# Patient Record
Sex: Female | Born: 1995 | Race: Black or African American | Hispanic: No | Marital: Single | Smoking: Never smoker
Health system: Southern US, Community
[De-identification: ages and names within clinical notes are randomized; demographics above are authoritative.]

---
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
## 2021-01-08 ENCOUNTER — Emergency Department (HOSPITAL_BASED_OUTPATIENT_CLINIC_OR_DEPARTMENT_OTHER): Payer: Medicaid Other

## 2021-01-08 ENCOUNTER — Encounter (HOSPITAL_BASED_OUTPATIENT_CLINIC_OR_DEPARTMENT_OTHER): Payer: Self-pay | Admitting: *Deleted

## 2021-01-08 ENCOUNTER — Other Ambulatory Visit: Payer: Self-pay

## 2021-01-08 ENCOUNTER — Emergency Department (HOSPITAL_BASED_OUTPATIENT_CLINIC_OR_DEPARTMENT_OTHER)
Admission: EM | Admit: 2021-01-08 | Discharge: 2021-01-08 | Disposition: A | Discharge status: Home / Self Care | Payer: Medicaid Other | Attending: Emergency Medicine | Admitting: Emergency Medicine

## 2021-01-08 DIAGNOSIS — N760 Acute vaginitis: Secondary | ICD-10-CM | POA: Diagnosis not present

## 2021-01-08 DIAGNOSIS — B9689 Other specified bacterial agents as the cause of diseases classified elsewhere: Secondary | ICD-10-CM | POA: Insufficient documentation

## 2021-01-08 DIAGNOSIS — R102 Pelvic and perineal pain: Secondary | ICD-10-CM | POA: Diagnosis present

## 2021-01-08 DIAGNOSIS — N730 Acute parametritis and pelvic cellulitis: Secondary | ICD-10-CM

## 2021-01-08 DIAGNOSIS — F17200 Nicotine dependence, unspecified, uncomplicated: Secondary | ICD-10-CM | POA: Diagnosis not present

## 2021-01-08 LAB — URINALYSIS, MICROSCOPIC (REFLEX)

## 2021-01-08 LAB — CBC WITH DIFFERENTIAL/PLATELET
Abs Immature Granulocytes: 0.11 10*3/uL — ABNORMAL HIGH (ref 0.00–0.07)
Basophils Absolute: 0 10*3/uL (ref 0.0–0.1)
Basophils Relative: 0 %
Eosinophils Absolute: 0 10*3/uL (ref 0.0–0.5)
Eosinophils Relative: 0 %
HCT: 38.4 % (ref 36.0–46.0)
Hemoglobin: 13.5 g/dL (ref 12.0–15.0)
Immature Granulocytes: 1 %
Lymphocytes Relative: 7 %
Lymphs Abs: 1.1 10*3/uL (ref 0.7–4.0)
MCH: 29.6 pg (ref 26.0–34.0)
MCHC: 35.2 g/dL (ref 30.0–36.0)
MCV: 84.2 fL (ref 80.0–100.0)
Monocytes Absolute: 1.1 10*3/uL — ABNORMAL HIGH (ref 0.1–1.0)
Monocytes Relative: 6 %
Neutro Abs: 15.1 10*3/uL — ABNORMAL HIGH (ref 1.7–7.7)
Neutrophils Relative %: 86 %
Platelets: 284 10*3/uL (ref 150–400)
RBC: 4.56 MIL/uL (ref 3.87–5.11)
RDW: 13.4 % (ref 11.5–15.5)
WBC: 17.4 10*3/uL — ABNORMAL HIGH (ref 4.0–10.5)
nRBC: 0 % (ref 0.0–0.2)

## 2021-01-08 LAB — COMPREHENSIVE METABOLIC PANEL
ALT: 14 U/L (ref 0–44)
AST: 15 U/L (ref 15–41)
Albumin: 4 g/dL (ref 3.5–5.0)
Alkaline Phosphatase: 66 U/L (ref 38–126)
Anion gap: 11 (ref 5–15)
BUN: 12 mg/dL (ref 6–20)
CO2: 22 mmol/L (ref 22–32)
Calcium: 9.2 mg/dL (ref 8.9–10.3)
Chloride: 104 mmol/L (ref 98–111)
Creatinine, Ser: 0.77 mg/dL (ref 0.44–1.00)
GFR, Estimated: >60 mL/min (ref >60)
Glucose, Bld: 95 mg/dL (ref 70–99)
Potassium: 3.5 mmol/L (ref 3.5–5.1)
Sodium: 137 mmol/L (ref 135–145)
Total Bilirubin: 1.6 mg/dL — ABNORMAL HIGH (ref 0.3–1.2)
Total Protein: 8 g/dL (ref 6.5–8.1)

## 2021-01-08 LAB — PREGNANCY, URINE: Preg Test, Ur: NEGATIVE

## 2021-01-08 LAB — URINALYSIS, ROUTINE W REFLEX MICROSCOPIC
Bilirubin Urine: NEGATIVE
Glucose, UA: NEGATIVE mg/dL
Ketones, ur: >80 mg/dL — AB
Nitrite: NEGATIVE
Protein, ur: NEGATIVE mg/dL
Specific Gravity, Urine: 1.01 (ref 1.005–1.030)
pH: 5.5 (ref 5.0–8.0)

## 2021-01-08 LAB — WET PREP, GENITAL
Sperm: NONE SEEN
Trich, Wet Prep: NONE SEEN
Yeast Wet Prep HPF POC: NONE SEEN

## 2021-01-08 LAB — LIPASE, BLOOD: Lipase: 22 U/L (ref 11–51)

## 2021-01-08 MED ORDER — METRONIDAZOLE 500 MG PO TABS: 500.0000 mg | ORAL_TABLET | Freq: Two times a day (BID) | ORAL | 0 refills | Status: AC

## 2021-01-08 MED ORDER — DOXYCYCLINE HYCLATE 100 MG PO CAPS: 100.0000 mg | ORAL_CAPSULE | Freq: Two times a day (BID) | ORAL | 0 refills | Status: AC

## 2021-01-08 MED ORDER — MORPHINE SULFATE (PF) 4 MG/ML IV SOLN
4.0000 mg | Freq: Once | INTRAVENOUS | Status: AC
  Administered 2021-01-08: 4 mg via INTRAVENOUS
  Filled 2021-01-08: qty 1

## 2021-01-08 MED ORDER — DOXYCYCLINE HYCLATE 100 MG PO TABS
100.0000 mg | ORAL_TABLET | Freq: Once | ORAL | Status: AC
  Administered 2021-01-08: 100 mg via ORAL
  Filled 2021-01-08: qty 1

## 2021-01-08 MED ORDER — CEFTRIAXONE SODIUM 500 MG IJ SOLR
500.0000 mg | Freq: Once | INTRAMUSCULAR | Status: AC
  Administered 2021-01-08: 500 mg via INTRAMUSCULAR
  Filled 2021-01-08: qty 500

## 2021-01-08 MED ORDER — ONDANSETRON HCL 4 MG/2ML IJ SOLN
4.0000 mg | Freq: Once | INTRAMUSCULAR | Status: AC
  Administered 2021-01-08: 4 mg via INTRAVENOUS
  Filled 2021-01-08: qty 2

## 2021-01-08 MED ORDER — SODIUM CHLORIDE 0.9 % IV BOLUS
1000.0000 mL | Freq: Once | INTRAVENOUS | Status: AC
  Administered 2021-01-08: 1000 mL via INTRAVENOUS

## 2021-01-08 MED ORDER — METRONIDAZOLE 500 MG PO TABS
500.0000 mg | ORAL_TABLET | Freq: Once | ORAL | Status: AC
  Administered 2021-01-08: 500 mg via ORAL
  Filled 2021-01-08: qty 1

## 2021-01-08 NOTE — ED Triage Notes (Signed)
Pain in her lower abdomen. Pain feels like gas.

## 2021-01-08 NOTE — ED Notes (Signed)
Assisted Pt. To restroom

## 2021-01-08 NOTE — ED Provider Notes (Signed)
[facility] EMERGENCY DEPARTMENT Provider Note   CSN: [id number] Arrival date & time: 01/08/21  1734     History Chief Complaint  Patient presents with  . Abdominal Pain    Laurie Mclean is a 25 y.o. female G1, P1 who presents today for evaluation of pelvic pain. She states that about 3 days ago she started having bilateral pelvic pain.  It started hurting more yesterday however became severe today. She denies any nausea vomiting or diarrhea.  She states that her pain hurts significantly more when she moves around.  She reports increased urination.  When she does urinate she feels like it makes her stomach hurt more, however denies specific dysuria.  She reports that she is sexually active with female partners. She did have abnormal discharge about 1 to 2 weeks ago. She denies any fevers or known sick contacts.  The pain has not radiated or moved.    HPI     History reviewed. No pertinent past medical history.  There are no problems to display for this patient.   History reviewed. No pertinent surgical history.   OB History   No obstetric history on file.     No family history on file.  Social History   Tobacco Use  . Smoking status: Never Smoker  . Smokeless tobacco: Current User  Substance Use Topics  . Alcohol use: Never  . Drug use: Never    Home Medications Prior to Admission medications   Not on File    Allergies    Patient has no known allergies.  Review of Systems   Review of Systems  Constitutional: Negative for chills and fever.  Respiratory: Negative for cough and shortness of breath.   Cardiovascular: Negative for chest pain.  Gastrointestinal: Positive for abdominal pain. Negative for diarrhea, nausea and vomiting.  Genitourinary: Positive for pelvic pain and vaginal discharge. Negative for decreased urine volume, dysuria, frequency and vaginal bleeding.  Neurological: Negative for weakness and headaches.  All other systems  reviewed and are negative.   Physical Exam Updated Vital Signs BP 123/88   Pulse 95   Temp 99.7 F (37.6 C) (Oral)   Resp 18   Ht 4\' 11"  (1.499 m)   Wt 57.6 kg   LMP 12/31/2020   SpO2 100%   BMI 25.65 kg/m   Physical Exam Vitals and nursing note reviewed. Exam conducted with a chaperone present (Female ED RN).  Constitutional:      Appearance: She is ill-appearing. She is not diaphoretic.  HENT:     Head: Normocephalic and atraumatic.  Eyes:     General: No scleral icterus.       Right eye: No discharge.        Left eye: No discharge.     Conjunctiva/sclera: Conjunctivae normal.  Cardiovascular:     Rate and Rhythm: Normal rate and regular rhythm.  Pulmonary:     Effort: Pulmonary effort is normal. No respiratory distress.     Breath sounds: No stridor.  Abdominal:     General: Bowel sounds are normal. There is no distension.     Palpations: Abdomen is soft.     Tenderness: There is abdominal tenderness in the right lower quadrant, suprapubic area and left lower quadrant.  Genitourinary:    Comments: Normal external female genitalia.  There is copious amount of frothy white discharge in the vaginal canal.  Cervix appears irritated and friable.  There is diffuse tenderness in bilateral adnexa and cervical motion tenderness.  Musculoskeletal:        General: No deformity.     Cervical back: Normal range of motion.  Skin:    General: Skin is warm and dry.  Neurological:     Mental Status: She is alert.     Motor: No abnormal muscle tone.  Psychiatric:        Behavior: Behavior normal.     ED Results / Procedures / Treatments   Labs (all labs ordered are listed, but only abnormal results are displayed) Labs Reviewed  WET PREP, GENITAL - Abnormal; Notable for the following components:      Result Value   Clue Cells Wet Prep HPF POC PRESENT (*)    WBC, Wet Prep HPF POC MANY (*)    All other components within normal limits  URINALYSIS, ROUTINE W REFLEX  MICROSCOPIC - Abnormal; Notable for the following components:   APPearance CLOUDY (*)    Hgb urine dipstick LARGE (*)    Ketones, ur >80 (*)    Leukocytes,Ua MODERATE (*)    All other components within normal limits  COMPREHENSIVE METABOLIC PANEL - Abnormal; Notable for the following components:   Total Bilirubin 1.6 (*)    All other components within normal limits  CBC WITH DIFFERENTIAL/PLATELET - Abnormal; Notable for the following components:   WBC 17.4 (*)    Neutro Abs 15.1 (*)    Monocytes Absolute 1.1 (*)    Abs Immature Granulocytes 0.11 (*)    All other components within normal limits  URINALYSIS, MICROSCOPIC (REFLEX) - Abnormal; Notable for the following components:   Bacteria, UA FEW (*)    All other components within normal limits  PREGNANCY, URINE  LIPASE, BLOOD  HIV ANTIBODY (ROUTINE TESTING W REFLEX)  RPR  GC/CHLAMYDIA PROBE AMP ([place]) NOT AT [facility]    EKG None  Radiology No results found.  Procedures Procedures   Medications Ordered in ED Medications  morphine 4 MG/ML injection 4 mg (4 mg Intravenous Given 01/08/21 1857)  ondansetron (ZOFRAN) injection 4 mg (4 mg Intravenous Given 01/08/21 1857)  sodium chloride 0.9 % bolus 1,000 mL (0 mLs Intravenous Stopped 01/08/21 2002)    ED Course  I have reviewed the triage vital signs and the nursing notes.  Pertinent labs & imaging results that were available during my care of the patient were reviewed by me and considered in my medical decision making (see chart for details).    MDM Rules/Calculators/A&P                         Patient is a 25 year old woman who presents today for evaluation of bilateral lower abdominal pain over the past few days. On exam she is significant tenderness in bilateral lower abdomen and appears uncomfortable. Pelvic exam is performed with red and friable cervix and frothy discharge in the vaginal canal along with cervical motion tenderness and bilateral adnexa  tenderness. Here patient is borderline febrile with a temp of 99.7, she has a leukocytosis and has had periods of high heart rate.  Without a clear bacterial infection code sepsis is not called.    GC testing is sent. Pelvic ultrasound is ordered given the degree of her pain.  At shift change care was transferred to Dr. Lockie Mola who will follow pending studies, re-evaulate and determine disposition.     Final Clinical Impression(s) / ED Diagnoses Final diagnoses:  BV (bacterial vaginosis)  Pelvic pain    Rx / DC Orders ED Discharge  Orders    None       Norman Clay 01/08/21 2015    Virgina Norfolk, DO 01/08/21 2305

## 2021-01-08 NOTE — ED Provider Notes (Signed)
I personally evaluated the patient during the encounter and completed a history, physical, procedures, medical decision making to contribute to the overall care of the patient and decision making for the patient briefly, the patient is a 25 y.o. female help with pain.  Started several days ago.  PA did a pelvic exam and concern for pelvic inflammatory disease.  Clue cells are positive.  Trichomonas negative.  Urinalysis negative for infection.  White count mildly elevated.  CT scan showed no appendicitis.  May be some cystitis.  Pelvic ultrasound showed nonspecific changes.  No torsion.  No obvious abscess.  Overall concern for pelvic inflammatory disease.  Will treat with antibiotics.  Educated about safe sex practices.  Discharged in good condition.  Nontoxic-appearing.   EKG Interpretation None           Virgina Norfolk, DO 01/08/21 2306

## 2021-01-08 NOTE — ED Notes (Signed)
ED Provider at bedside. 

## 2021-01-09 LAB — HIV ANTIBODY (ROUTINE TESTING W REFLEX): HIV Screen 4th Generation wRfx: NONREACTIVE

## 2021-01-09 LAB — RPR
RPR Ser Ql: REACTIVE — AB
RPR Titer: 1:2 {titer}

## 2021-01-12 LAB — GC/CHLAMYDIA PROBE AMP (~~LOC~~) NOT AT ARMC
Chlamydia: POSITIVE — AB
Comment: NEGATIVE
Comment: NORMAL
Neisseria Gonorrhea: POSITIVE — AB

## 2021-01-14 LAB — T.PALLIDUM AB, TOTAL: T Pallidum Abs: NONREACTIVE

## 2022-08-27 IMAGING — US US PELVIS COMPLETE TRANSABD/TRANSVAG W DUPLEX
1 series · 13 of 25 positions shown · non-contrast
Comparison: None.

CLINICAL DATA: Bilateral pelvic pain. Fever and elevated white
blood cell count. Mild vaginal discharge.

EXAM:
TRANSABDOMINAL AND TRANSVAGINAL ULTRASOUND OF PELVIS
DOPPLER ULTRASOUND OF OVARIES
TECHNIQUE: Both transabdominal and transvaginal ultrasound examinations of the
pelvis were performed. Transabdominal technique was performed for
global imaging of the pelvis including uterus, ovaries, adnexal
regions, and pelvic cul-de-sac.
It was necessary to proceed with endovaginal exam following the
transabdominal exam to visualize the uterus, endometrium and adnexa.
Color and duplex Doppler ultrasound was utilized to evaluate blood
flow to the ovaries.

[Series 1: us pelvis complete transabd/transvag w duplex · 13 of 81 slices shown]
[im 1/81]
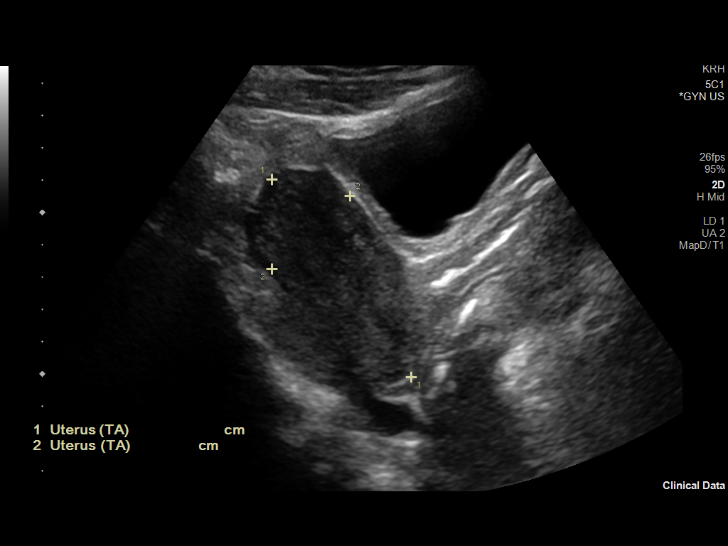
[im 7/81]
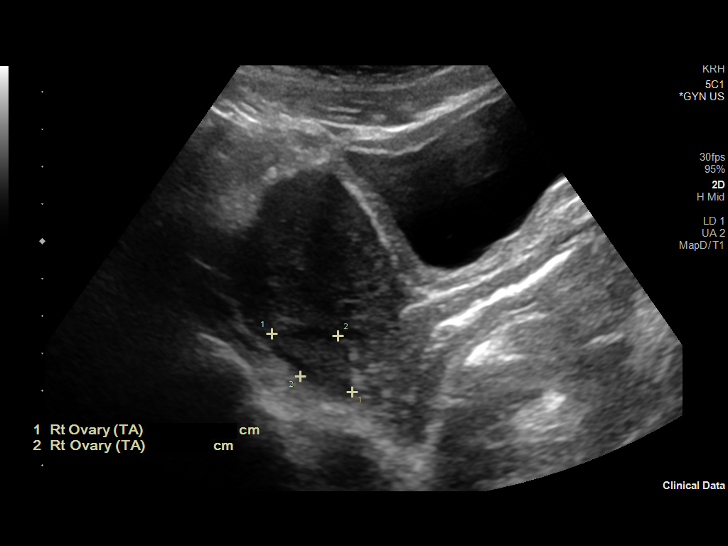
[im 14/81]
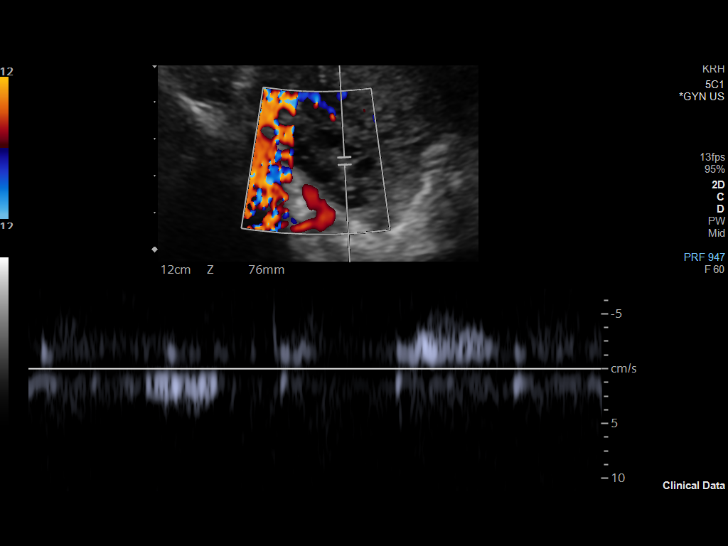
[im 21/81]
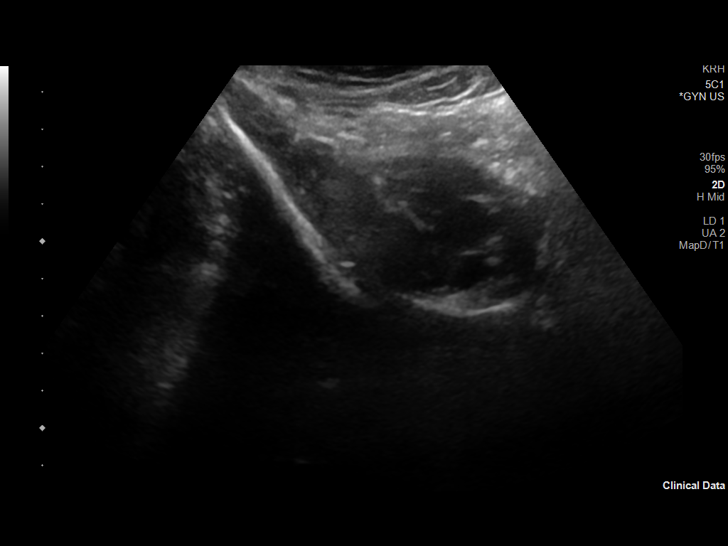
[im 27/81]
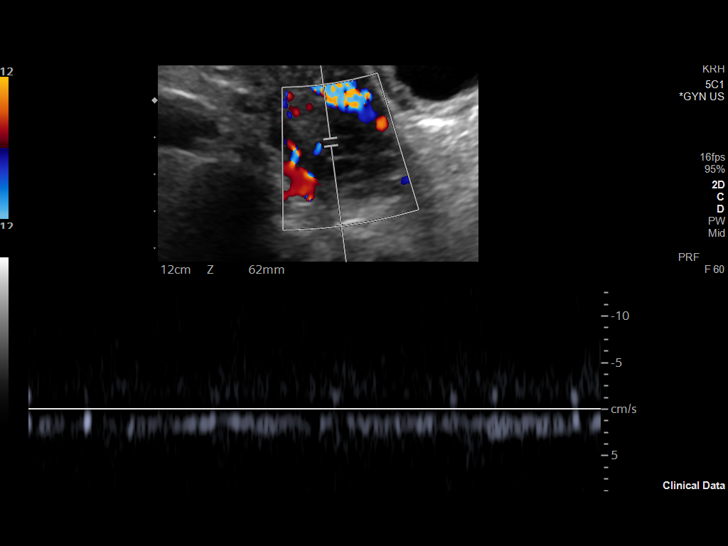
[im 34/81]
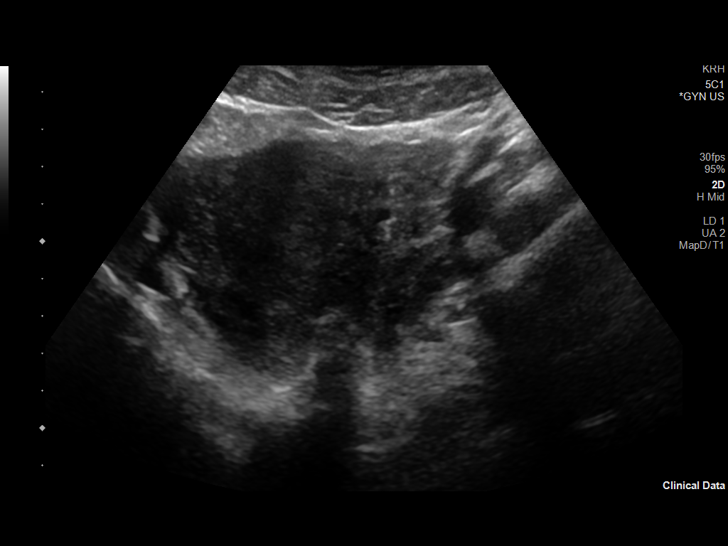
[im 41/81]
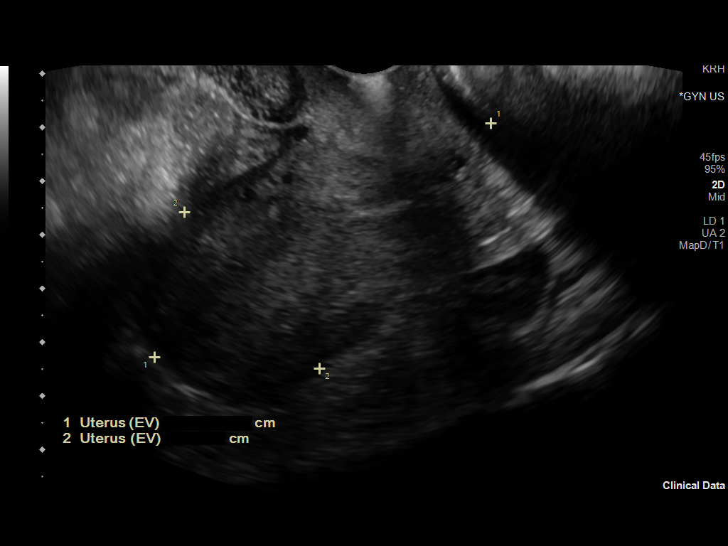
[im 47/81]
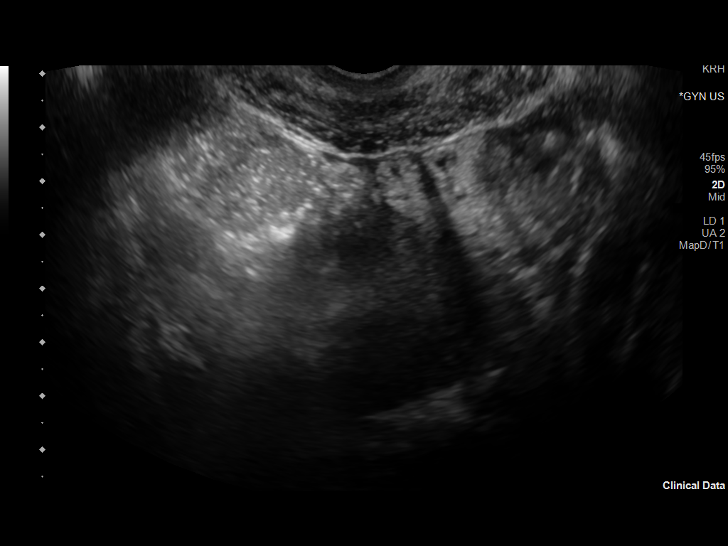
[im 54/81]
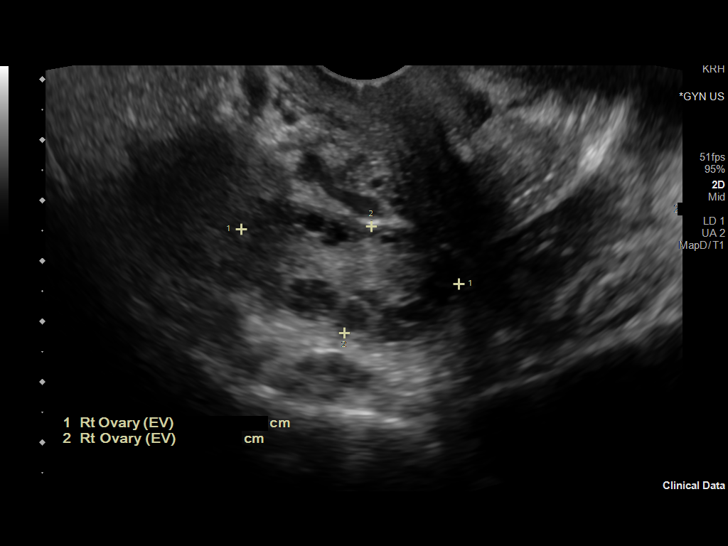
[im 61/81]
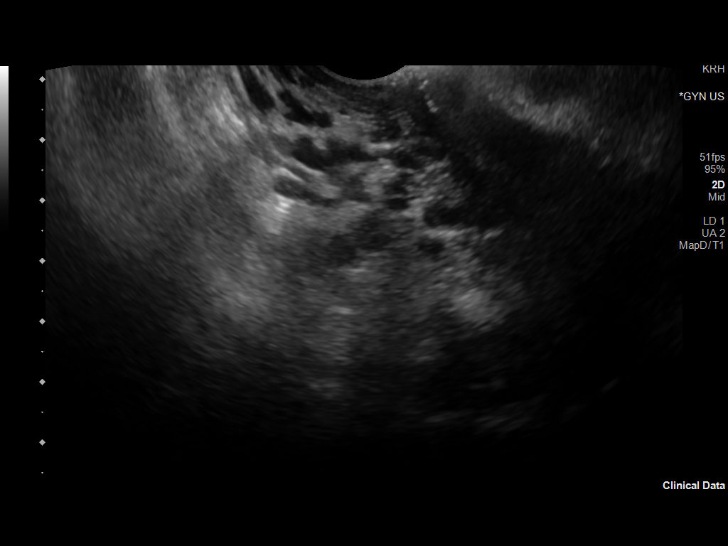
[im 67/81]
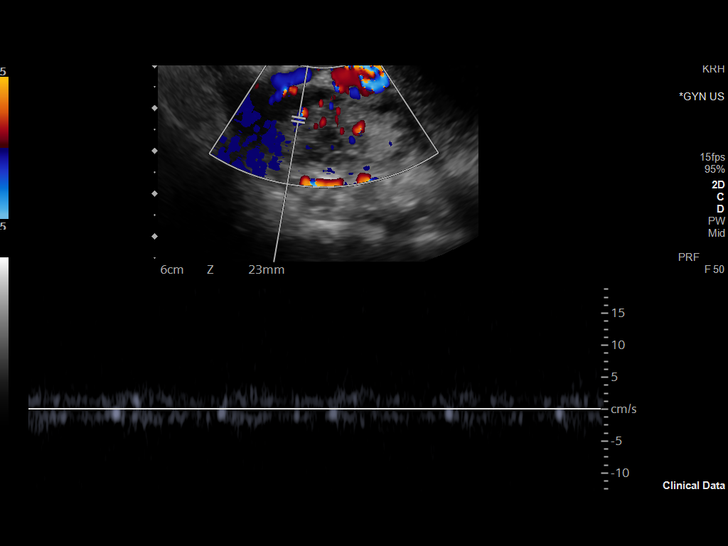
[im 74/81]
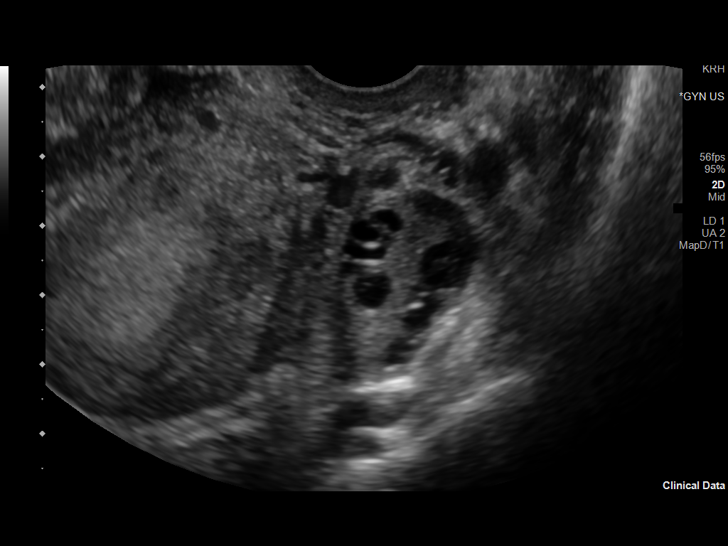
[im 81/81]
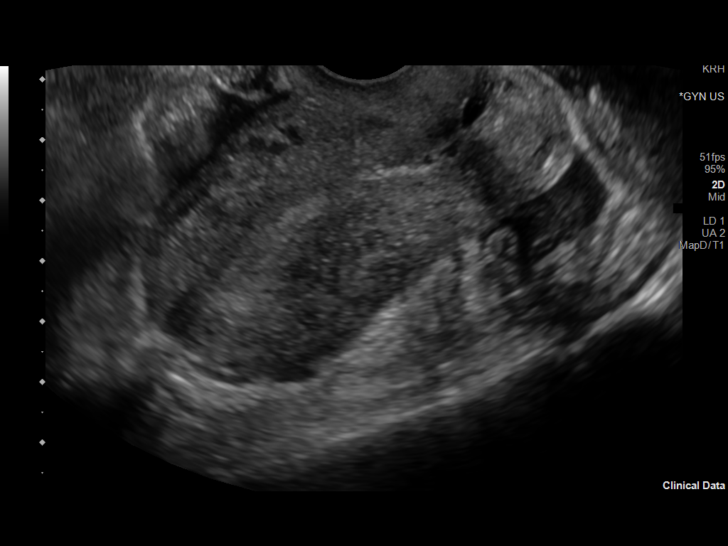

[13 of 25 positions shown; findings below may reference images not displayed]

FINDINGS: Uterus

Measurements: 7.6 x 3.9 x 5 = volume: 77 mL. Uterus is anteverted.
No fibroids or other mass visualized.

Endometrium

Thickness: 6 mm. Trace fluid in the endocervical canal. No focal
abnormality visualized.

Right ovary

Measurements: 3.7 x 1.8 x 1.7 cm = volume: 6.1 mL. Normal appearance
with physiologic follicles. No cyst or adnexal mass. No evidence of
hydro or pyosalpinx. Normal blood flow.

Left ovary

Measurements: 3.4 x 2.1 x 2.1 cm = volume: 8 mL. Multiple follicles
that are peripherally oriented with mild echogenic stroma. No cyst
or adnexal mass. No evidence of hydro or pyosalpinx. Normal blood
flow.

Pulsed Doppler evaluation of both ovaries demonstrates normal
low-resistance arterial and venous waveforms.

Other findings

Trace pelvic free fluid without focal fluid collection.
IMPRESSION: 1. Morphologic findings of the left ovary the can be seen in the
setting of polycystic ovarian syndrome, though nonspecific.
2. Normal blood flow to both ovaries without torsion.
3. Trace fluid in the endometrial canal may be physiologic.
Unremarkable appearance of the uterus.

## 2022-08-27 IMAGING — CT CT ABD-PELV W/O CM
2 of 4 series · 16 of 46 positions shown, 18 images · non-contrast
Comparison: Pelvic ultrasound 01/08/2021

CLINICAL DATA: Lower abdominal pain, described as a gas like
sensation

EXAM:
CT ABDOMEN AND PELVIS WITHOUT CONTRAST
TECHNIQUE: Multidetector CT imaging of the abdomen and pelvis was performed
following the standard protocol without IV contrast.

[Series 2: axial st · axial · 0.72mm/px · z∈[+647,+997]mm · 13 of 78 slices shown, 15 images]
[im 4/78  soft-tissue]
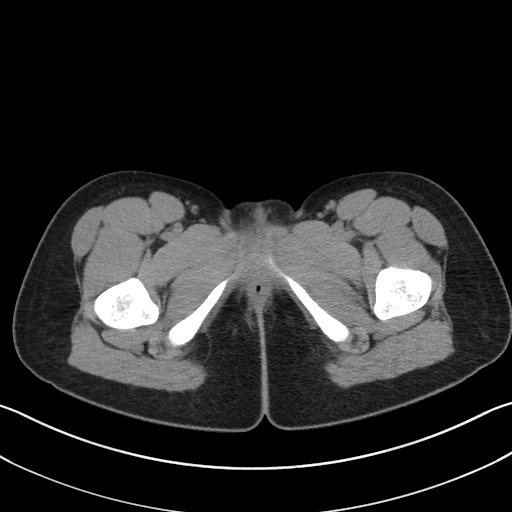
[im 4/78  bone]
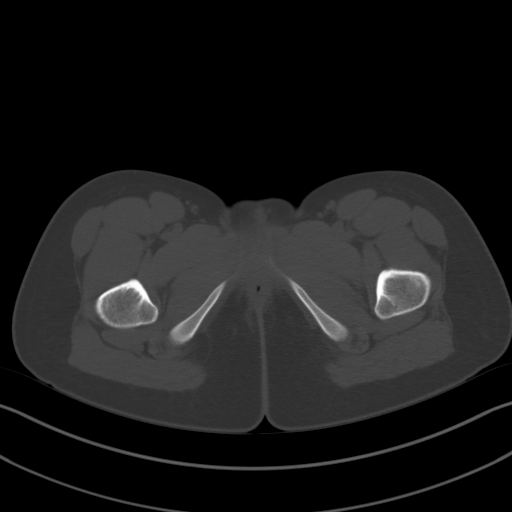
[im 10/78  soft-tissue]
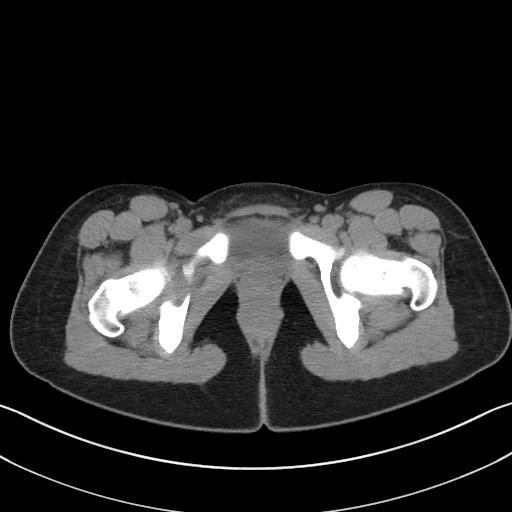
[im 17/78  soft-tissue]
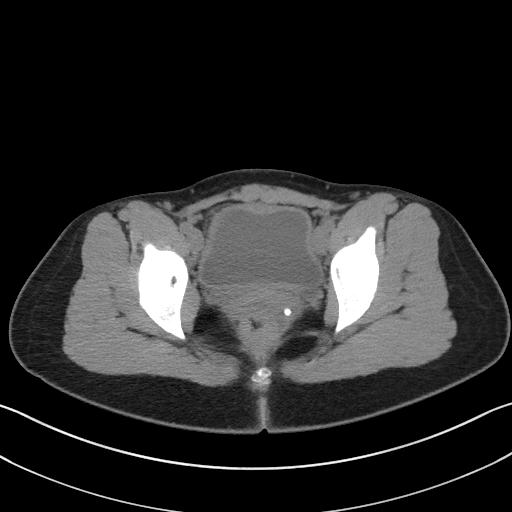
[im 23/78  soft-tissue]
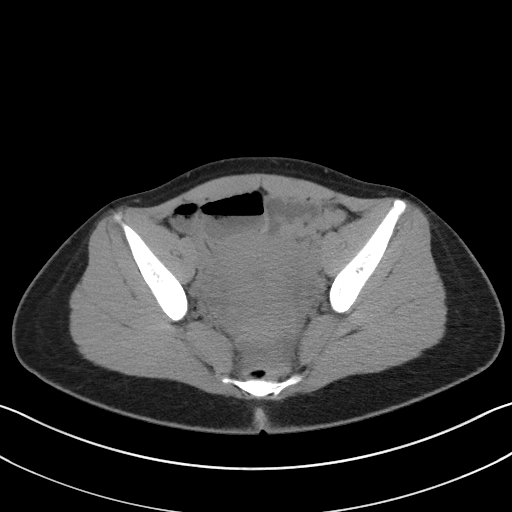
[im 26/78  soft-tissue]
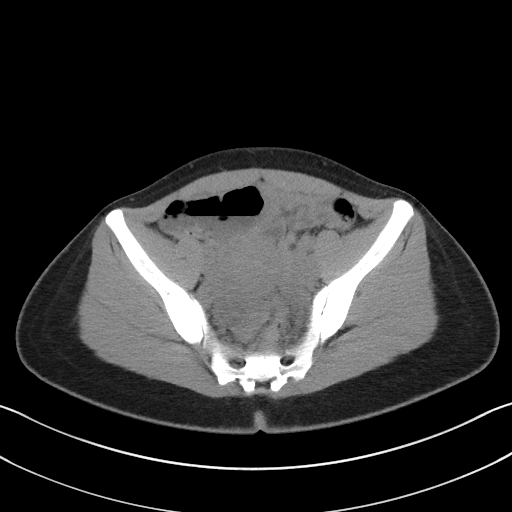
[im 33/78  soft-tissue]
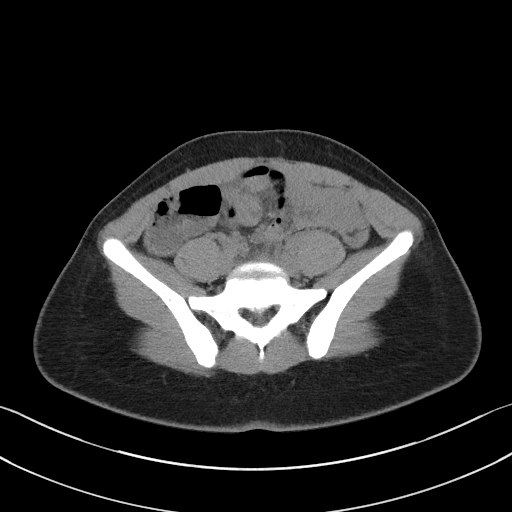
[im 39/78  soft-tissue]
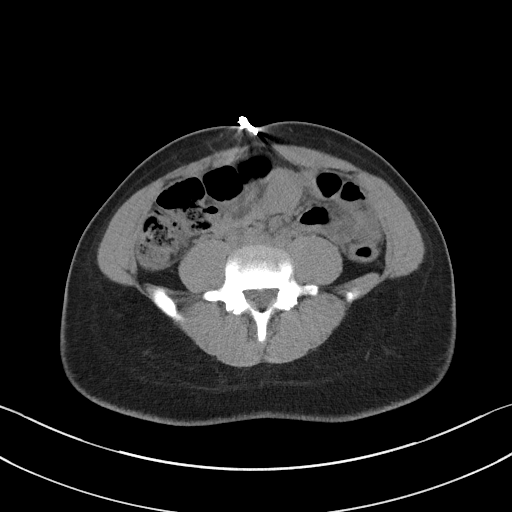
[im 45/78  soft-tissue]
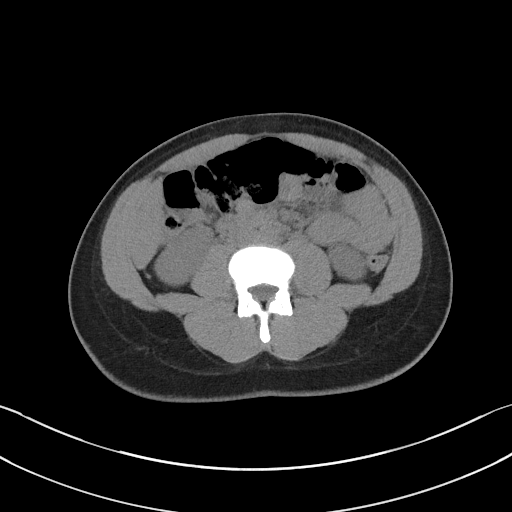
[im 52/78  soft-tissue]
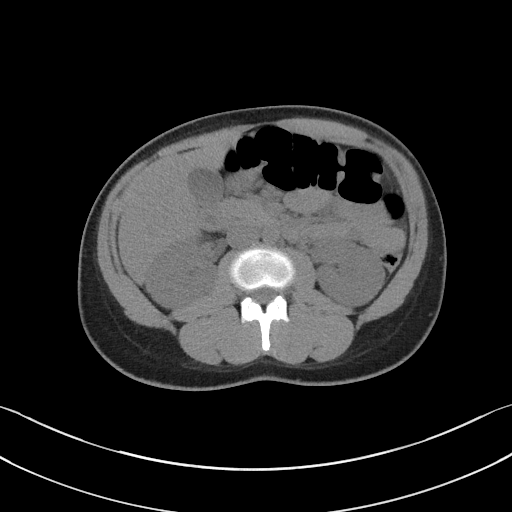
[im 52/78  bone]
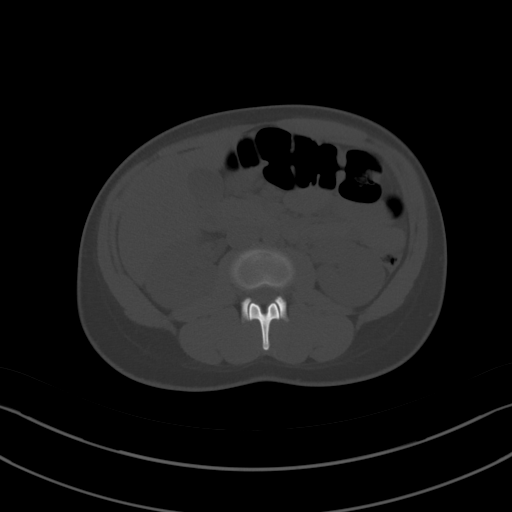
[im 55/78  soft-tissue]
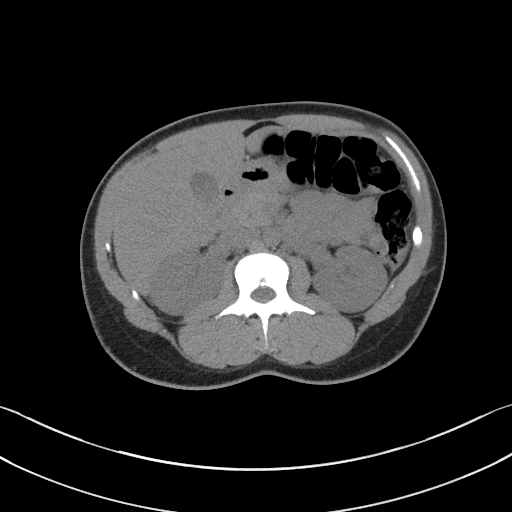
[im 61/78  soft-tissue]
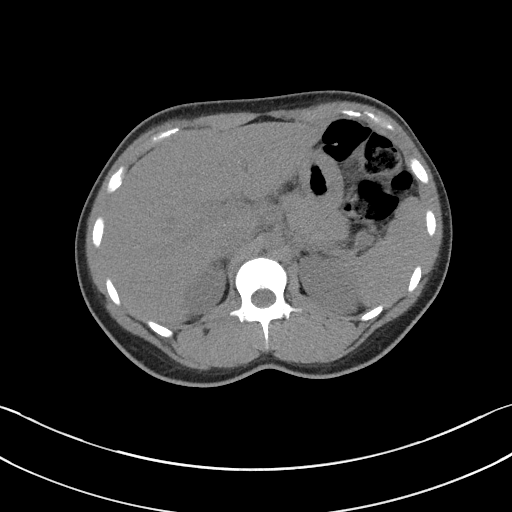
[im 68/78  soft-tissue]
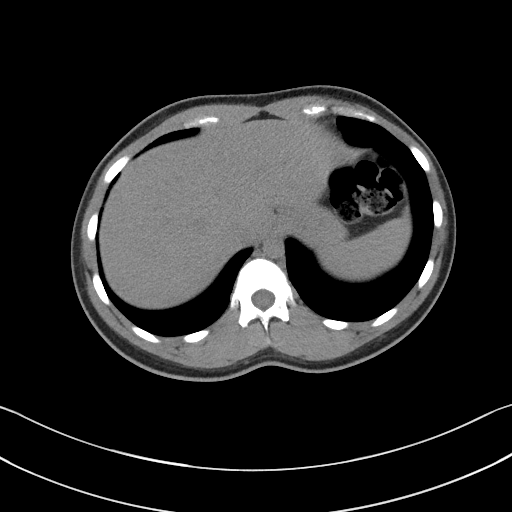
[im 74/78  soft-tissue]
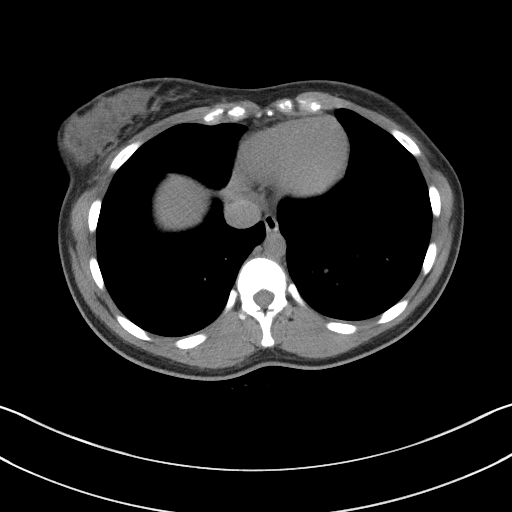

[Series 5: coronal st · coronal · 0.59mm/px · 3 of 84 slices shown]
[im 28/84  soft-tissue]
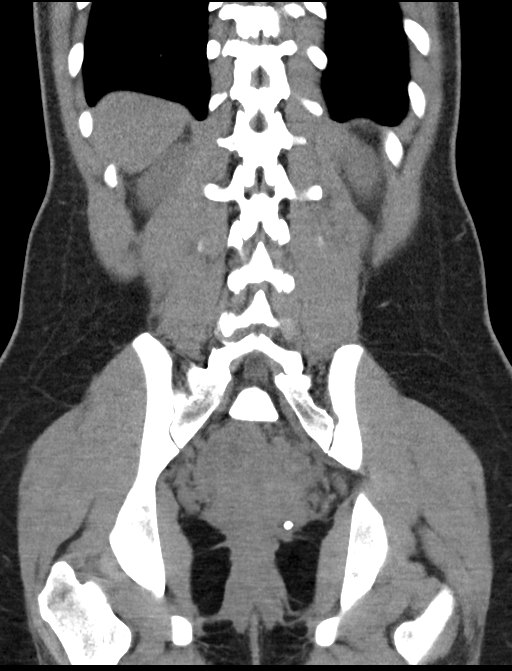
[im 37/84  soft-tissue]
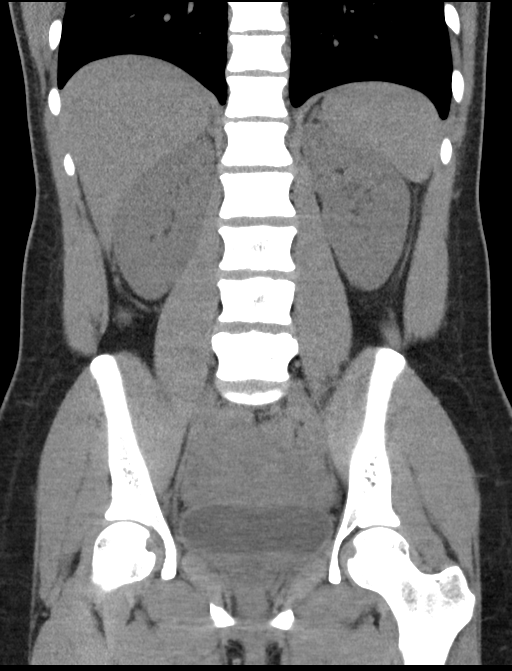
[im 47/84  soft-tissue]
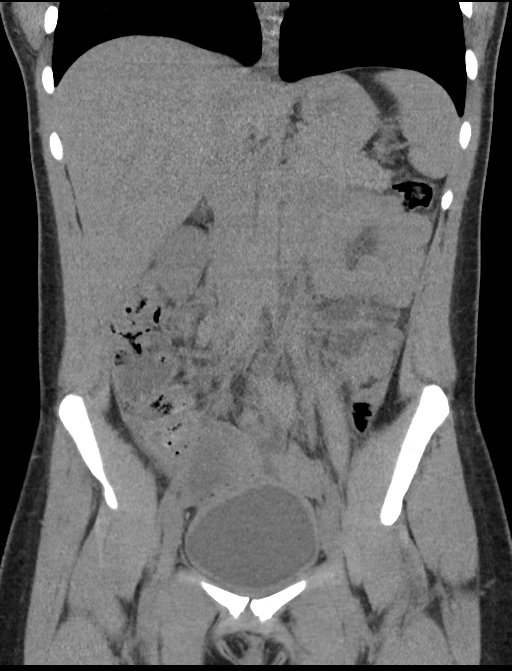

[16 of 46 positions shown; findings below may reference images not displayed]

FINDINGS: Lower chest: Lung bases are clear. Normal heart size. No pericardial
effusion.

Hepatobiliary: No visible or contour deforming focal liver lesion.
Smooth liver surface contour. Normal hepatic attenuation. Normal
gallbladder and biliary tree without visible calcified gallstone.

Pancreas: No pancreatic ductal dilatation or surrounding
inflammatory changes.

Spleen: Normal in size. No concerning splenic lesions.

Adrenals/Urinary Tract: Normal adrenal glands. Kidneys are symmetric
in size and normally located. No visible or contour deforming renal
lesions. No urolithiasis or hydronephrosis. Few pelvic phleboliths.
Mild circumferential bladder wall thickening accounting for the
degree of distention.

Stomach/Bowel: Distal esophagus, stomach and duodenal sweep are
unremarkable. No small bowel wall thickening or dilatation. No
evidence of obstruction. A normal appendix is visualized. No
significant colonic thickening or dilatation accounting for
underdistention.

Vascular/Lymphatic: No significant vascular findings are present. No
enlarged abdominal or pelvic lymph nodes.

Reproductive: Anteverted uterus. No discrete worrisome adnexal
lesions are identified.

Other: Small volume low-attenuation free fluid in the deep
pelvis/posterior cul-de-sac is nonspecific in a reproductive age
female, often a normal physiologic finding. No free air. No bowel
containing hernia. Metallic ornamentation of the naval.

Musculoskeletal: No acute osseous abnormality or suspicious osseous
lesion. Mild disc bulges L4-5, L5-S1.
IMPRESSION: Mild circumferential bladder wall thickening accounting for the
degree of distention. Correlate with urinalysis to exclude cystitis.

Small volume low-attenuation free fluid in the deep pelvis,
nonspecific but often physiologic in a reproductive age female.
